# Patient Record
Sex: Female | Born: 1990 | Race: White | Hispanic: No | Marital: Single | Smoking: Current every day smoker
Health system: Southern US, Community
[De-identification: ages and names within clinical notes are randomized; demographics above are authoritative.]

## PROBLEM LIST (undated history)

## (undated) HISTORY — PX: APPENDECTOMY: SHX54

---

## 2012-02-21 ENCOUNTER — Emergency Department: Payer: Self-pay | Admitting: Emergency Medicine

## 2012-02-21 LAB — CBC
HCT: 37.7 % (ref 35.0–47.0)
HGB: 12.7 g/dL (ref 12.0–16.0)
MCHC: 33.6 g/dL (ref 32.0–36.0)
MCV: 84 fL (ref 80–100)
Platelet: 253 10*3/uL (ref 150–440)
RDW: 13.5 % (ref 11.5–14.5)
WBC: 10.5 10*3/uL (ref 3.6–11.0)

## 2012-02-21 LAB — COMPREHENSIVE METABOLIC PANEL
Albumin: 4 g/dL (ref 3.4–5.0)
Alkaline Phosphatase: 76 U/L (ref 50–136)
Anion Gap: 8 (ref 7–16)
BUN: 10 mg/dL (ref 7–18)
Calcium, Total: 8.7 mg/dL (ref 8.5–10.1)
Creatinine: 0.84 mg/dL (ref 0.60–1.30)
Glucose: 94 mg/dL (ref 65–99)
Osmolality: 278 (ref 275–301)
Potassium: 3.6 mmol/L (ref 3.5–5.1)
SGOT(AST): 20 U/L (ref 15–37)
Sodium: 140 mmol/L (ref 136–145)
Total Protein: 7.8 g/dL (ref 6.4–8.2)

## 2012-02-21 LAB — URINALYSIS, COMPLETE
Bacteria: NONE SEEN
Bilirubin,UR: NEGATIVE
Ketone: NEGATIVE
Nitrite: NEGATIVE
Specific Gravity: 1.027 (ref 1.003–1.030)
Squamous Epithelial: 2
WBC UR: 4 /HPF (ref 0–5)

## 2012-02-21 LAB — PREGNANCY, URINE: Pregnancy Test, Urine: NEGATIVE m[IU]/mL

## 2012-02-21 LAB — LIPASE, BLOOD: Lipase: 121 U/L (ref 73–393)

## 2012-02-21 LAB — WET PREP, GENITAL

## 2013-03-09 IMAGING — CT CT STONE STUDY
1 of 2 series · 16 of 32 positions shown, 20 images · non-contrast
Comparison: none

REASON FOR EXAM: RLQ PAIN, MICROSCOPIC HEMATURIA
COMMENTS:   May transport without cardiac monitor

PROCEDURE:     CT  - CT ABDOMEN /PELVIS WO (STONE)  - February 21, 2012  [DATE]
RESULT:
TECHNIQUE: Helical noncontrast 3 mm sections were obtained from the lung
bases through the pubic symphysis.

[Series 2: 3mm soft tissue · axial · 0.70mm/px · z∈[-469,-37]mm · 16 of 156 slices shown, 20 images]
[im 6/156  soft-tissue]
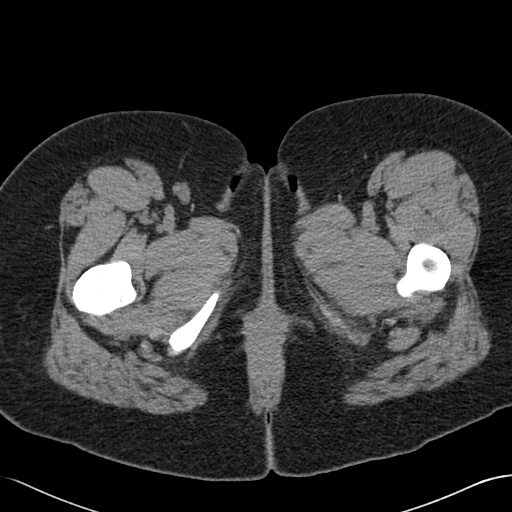
[im 6/156  bone]
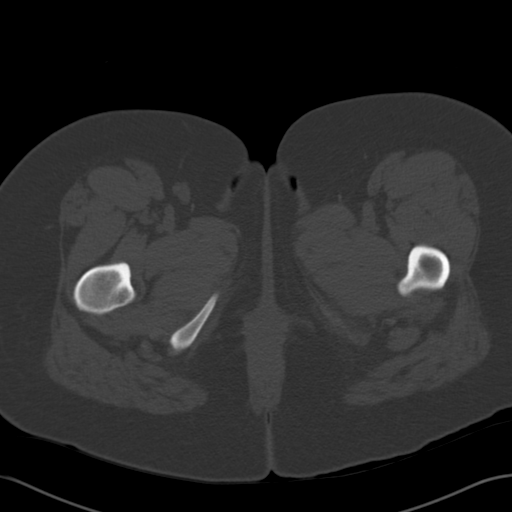
[im 18/156  soft-tissue]
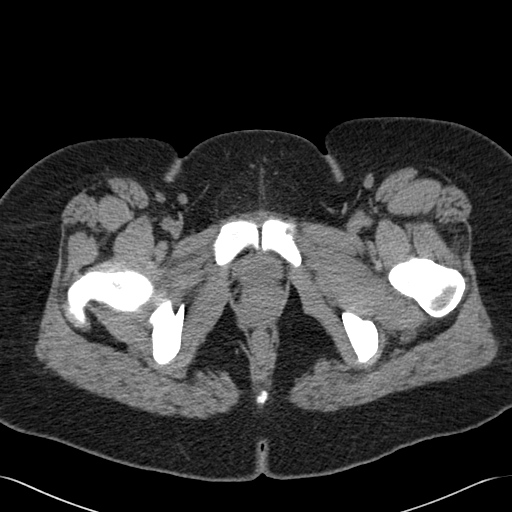
[im 30/156  soft-tissue]
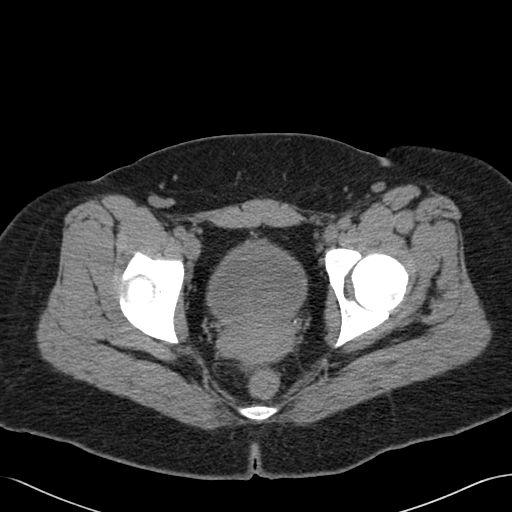
[im 42/156  soft-tissue]
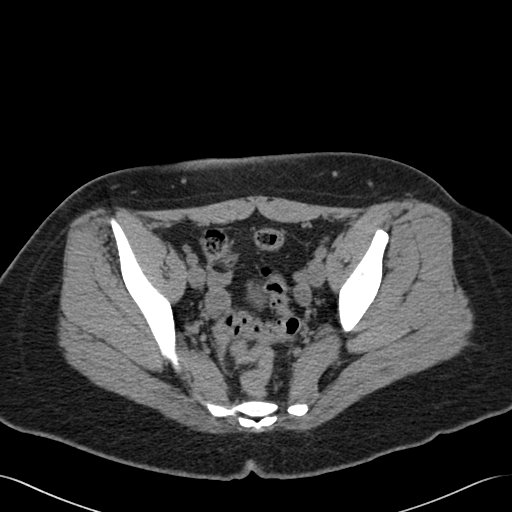
[im 54/156  soft-tissue]
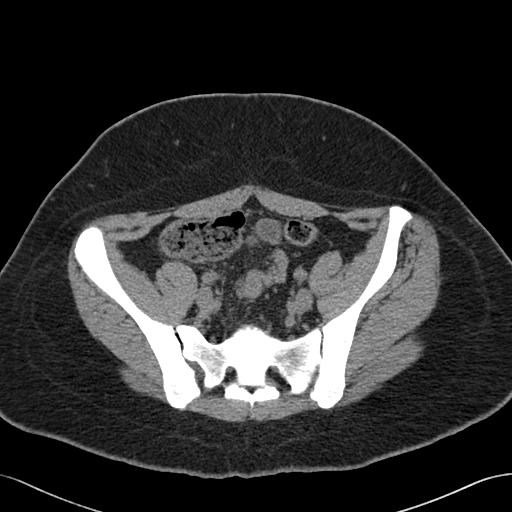
[im 60/156  soft-tissue]
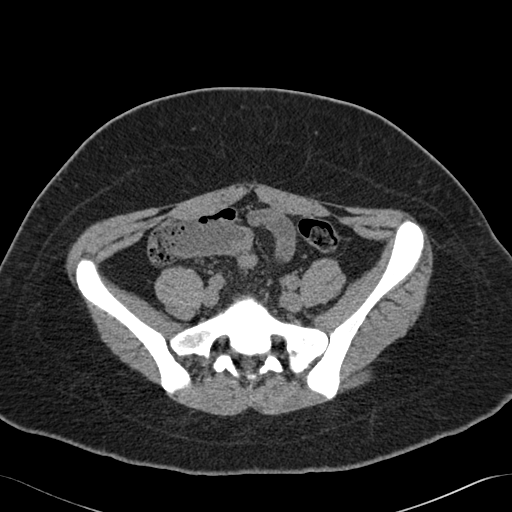
[im 72/156  soft-tissue]
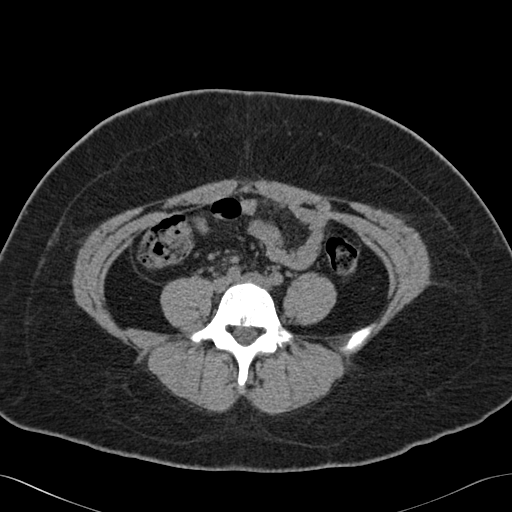
[im 84/156  soft-tissue]
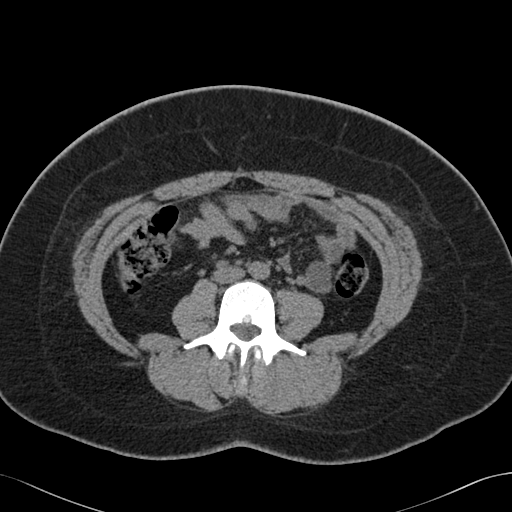
[im 96/156  soft-tissue]
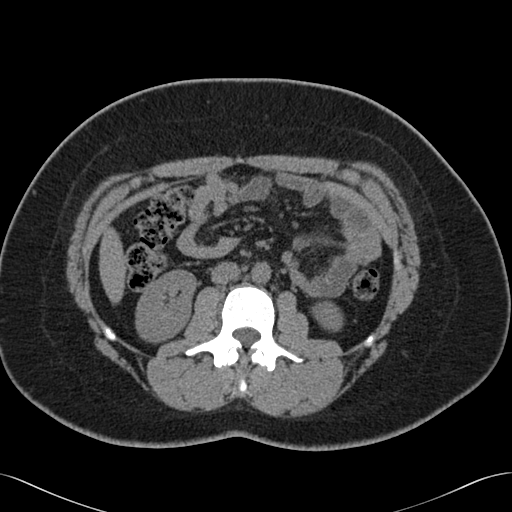
[im 96/156  bone]
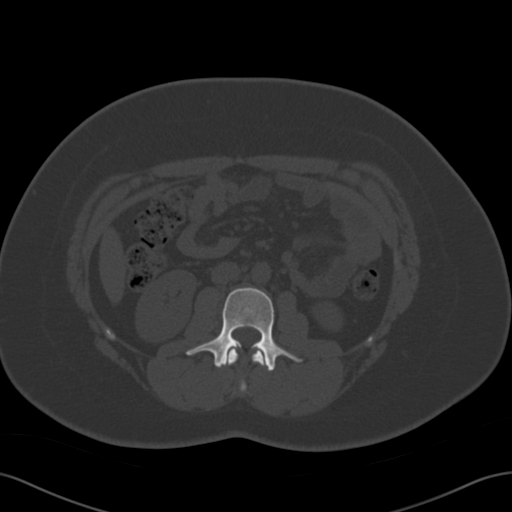
[im 102/156  soft-tissue]
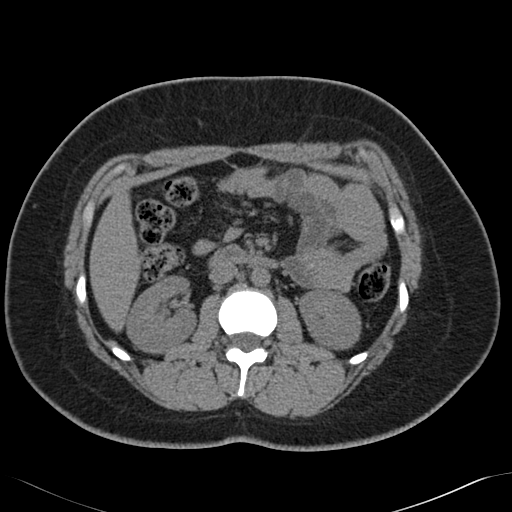
[im 114/156  soft-tissue]
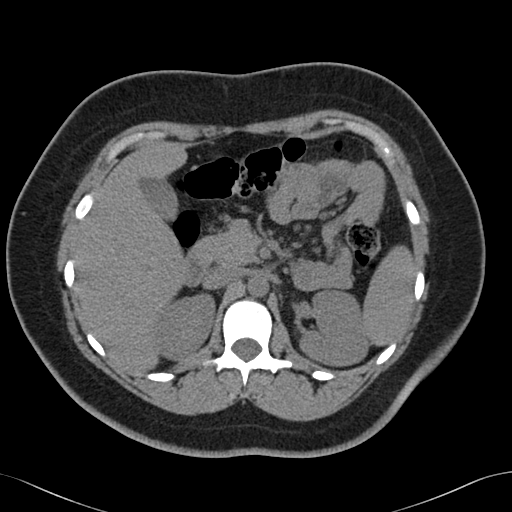
[im 126/156  soft-tissue]
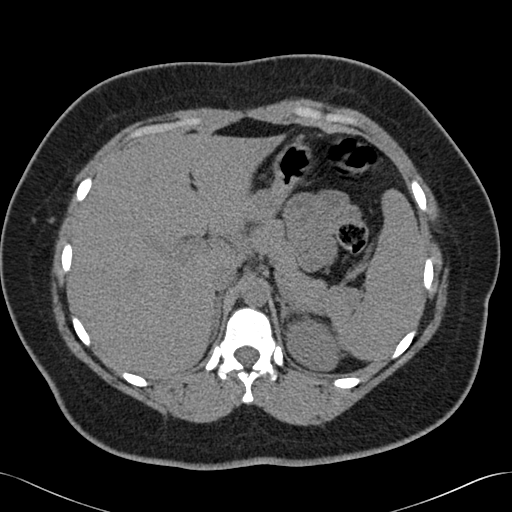
[im 132/156  lung]
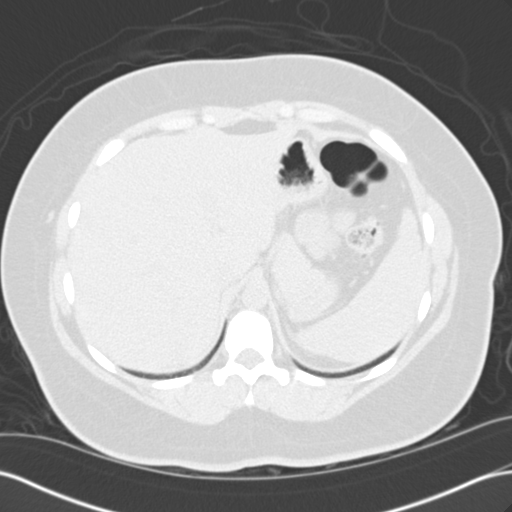
[im 138/156  soft-tissue]
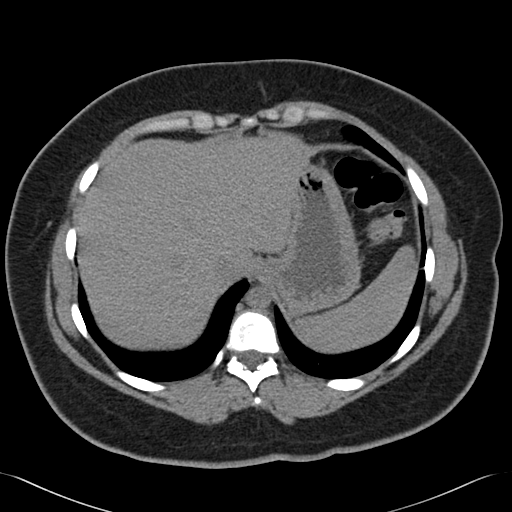
[im 138/156  lung]
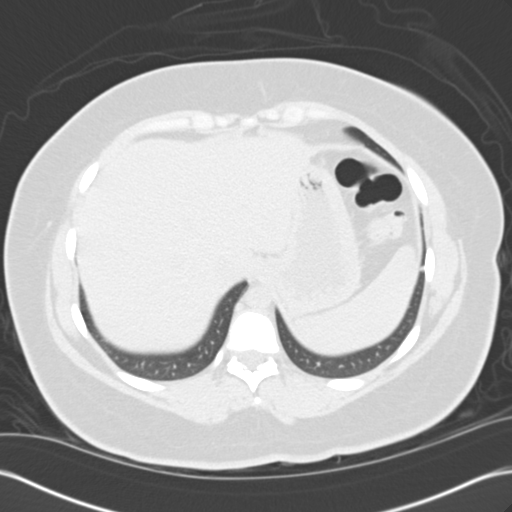
[im 144/156  lung]
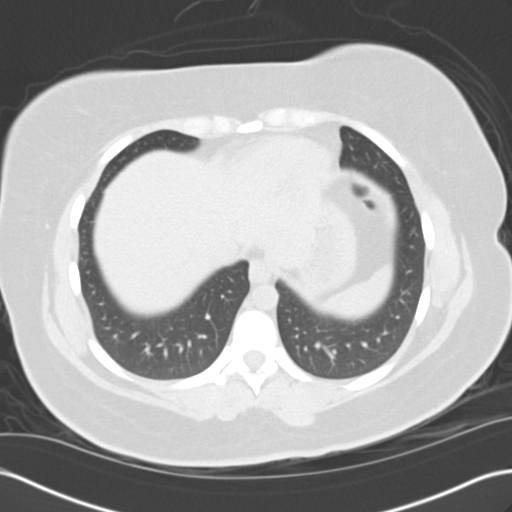
[im 150/156  soft-tissue]
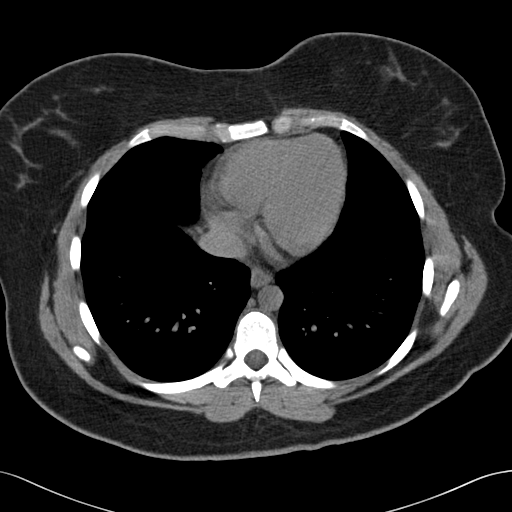
[im 150/156  lung]
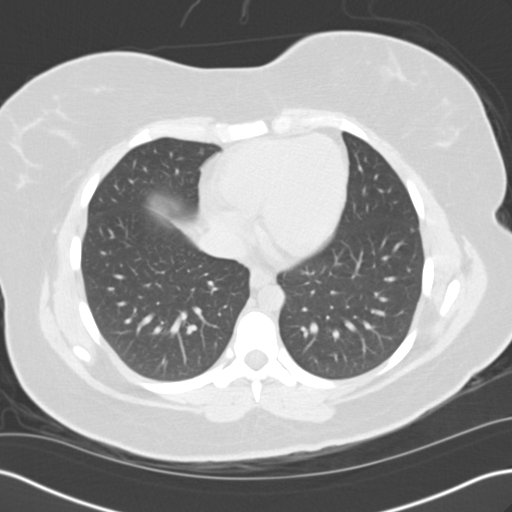

[16 of 32 positions shown; findings below may reference images not displayed]

FINDINGS: The lung bases are unremarkable.

Noncontrast evaluation of the liver, spleen, adrenals, pancreas, and kidneys
is unremarkable. There are no secondary signs reflecting enteritis, colitis,
diverticulitis or appendicitis. The appendix is not clearly identified.
There is no evidence of an abdominal aortic aneurysm.
IMPRESSION: 1.     Within the limitations of a noncontrast CT, there is no evidence of
obstructive or inflammatory abnormalities.
2.     Dr. Kaichi of the Emergency Department was informed of these findings
via a preliminary faxed report.

## 2013-03-09 IMAGING — US US PELV - US TRANSVAGINAL
1 series · 14 of 25 positions shown · non-contrast
Comparison: none

REASON FOR EXAM: R ADNEXAL PAIN
COMMENTS:   May transport without cardiac monitor

[Series 1: us pelv - us transvaginal · 0.26mm/px · 14 of 62 slices shown]
[im 1/62]
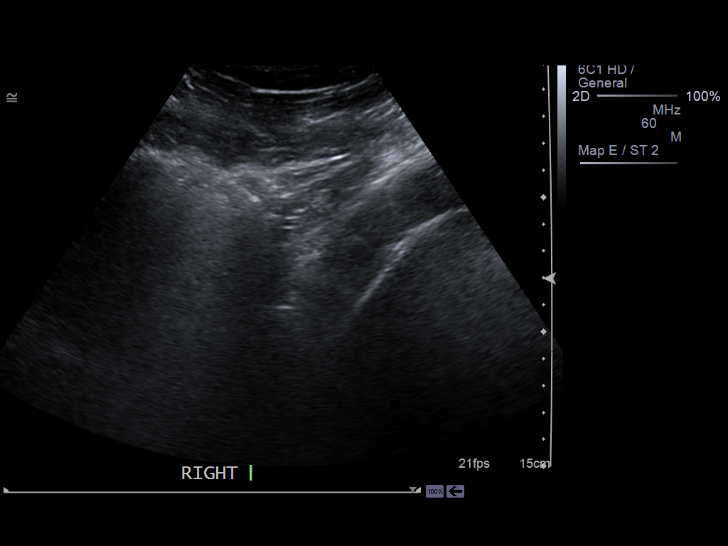
[im 6/62]
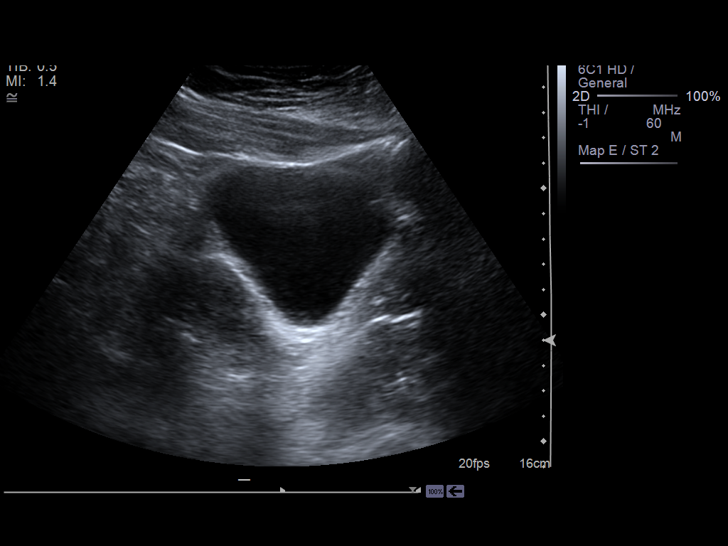
[im 11/62]
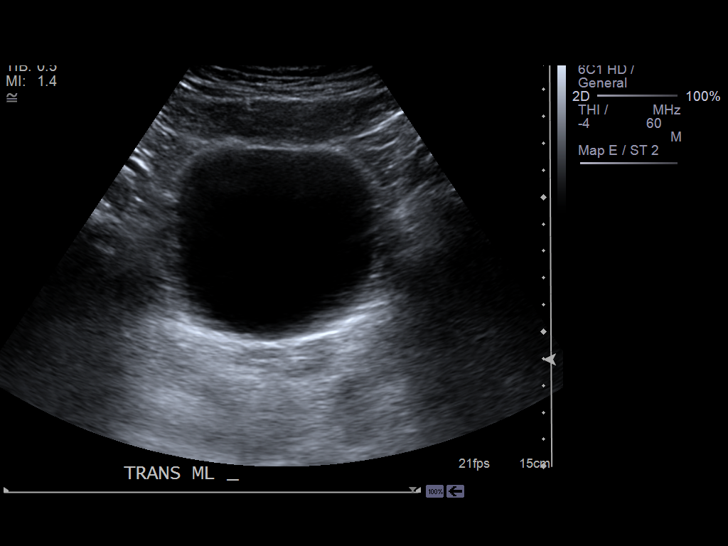
[im 16/62]
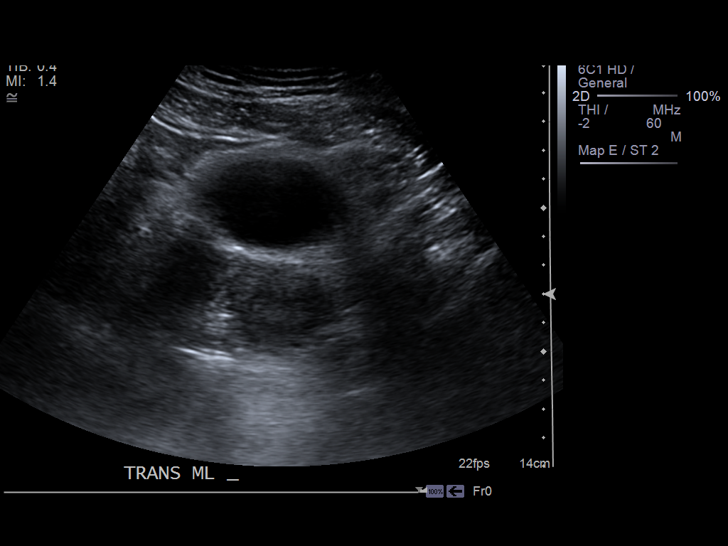
[im 21/62]
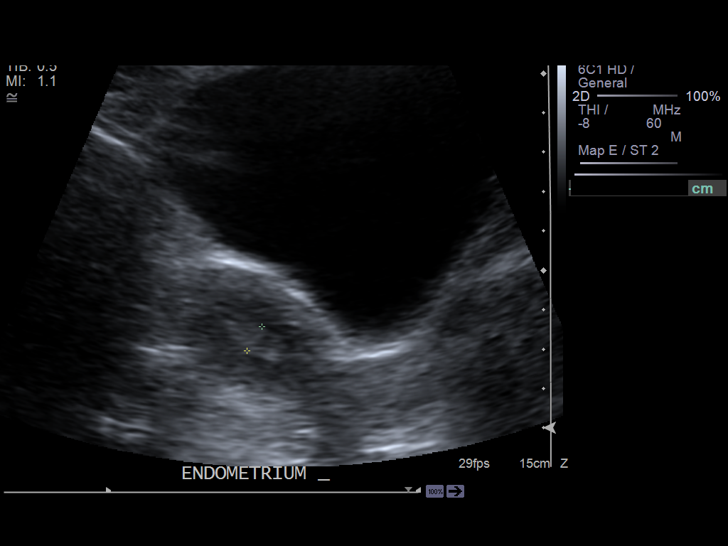
[im 23/62]
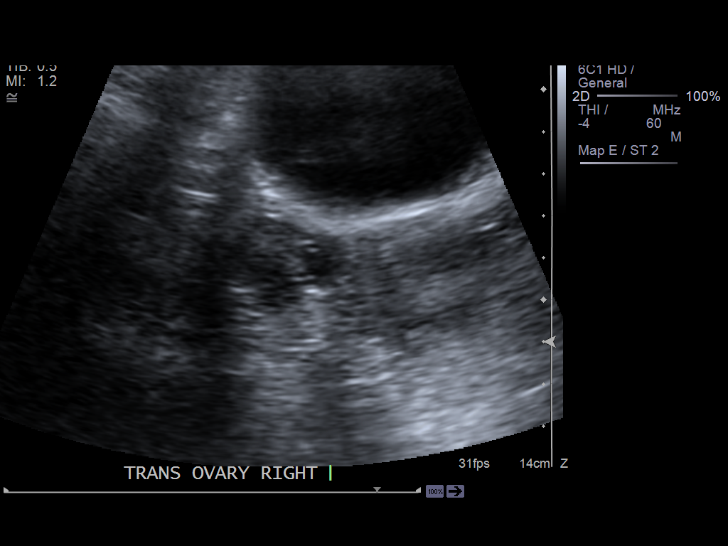
[im 28/62]
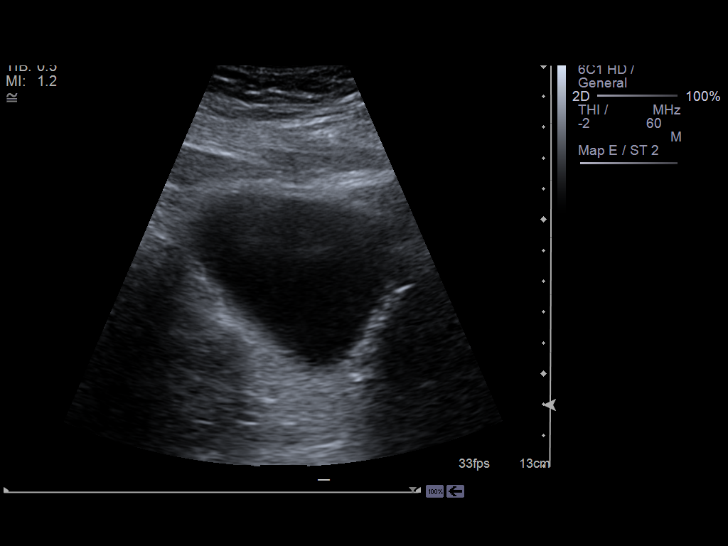
[im 34/62]
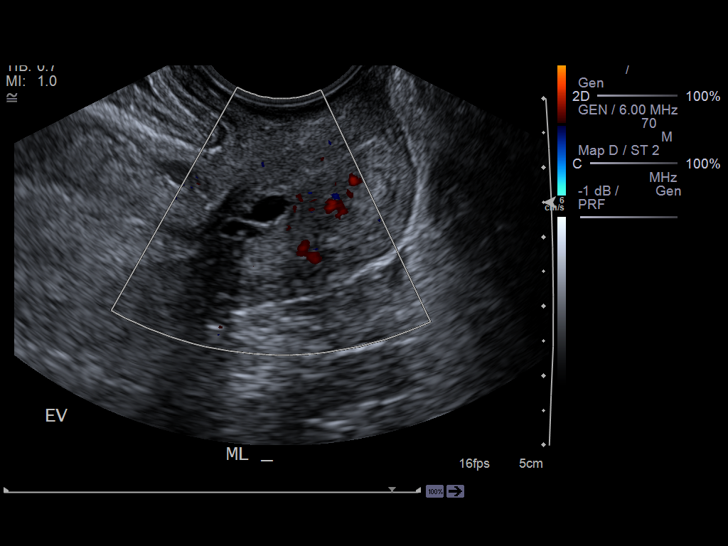
[im 39/62]
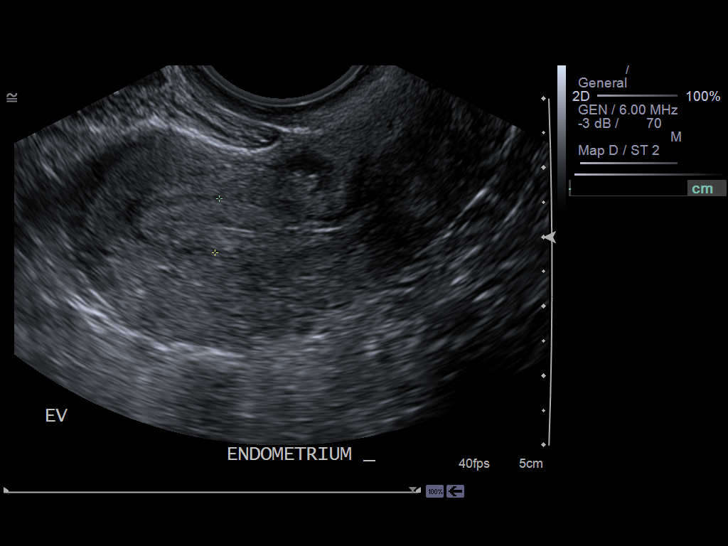
[im 41/62]
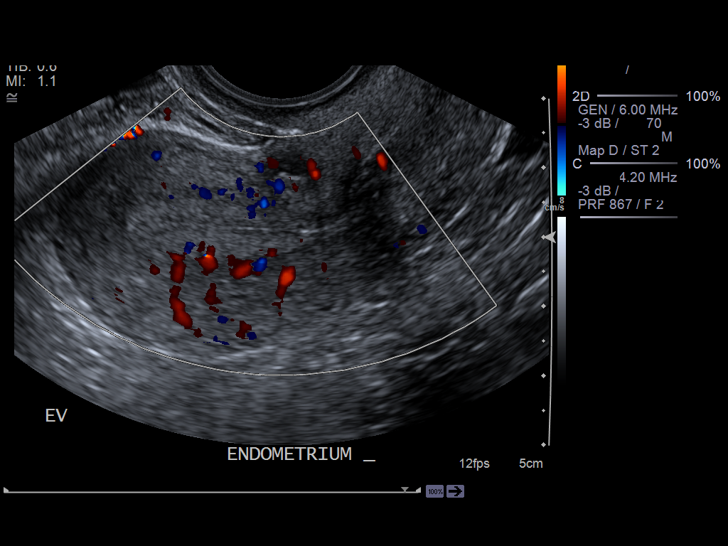
[im 46/62]
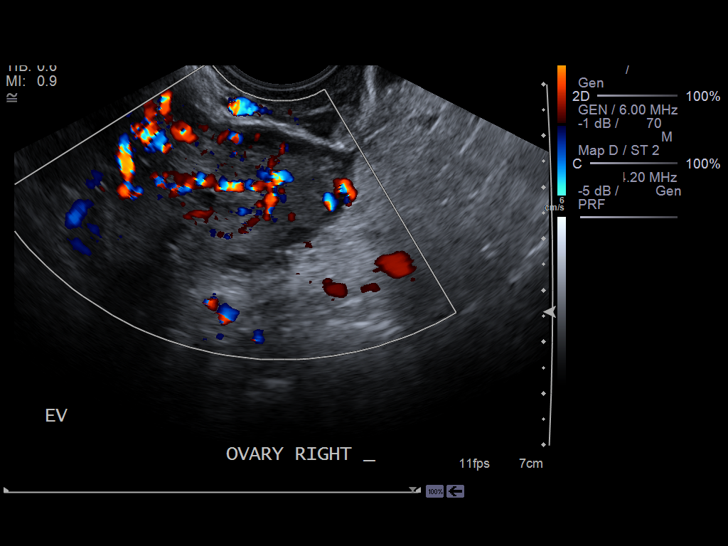
[im 51/62]
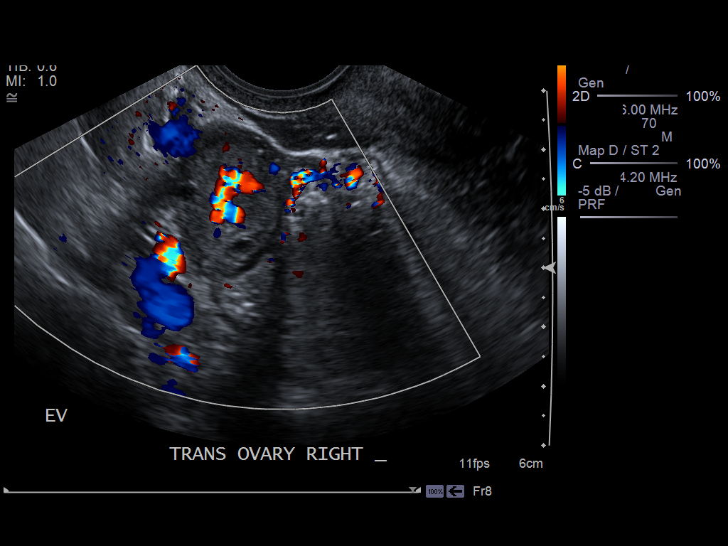
[im 56/62]
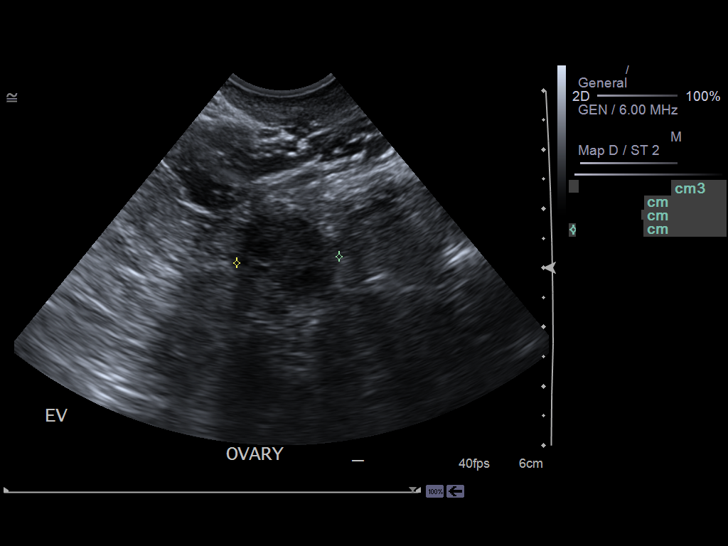
[im 62/62]
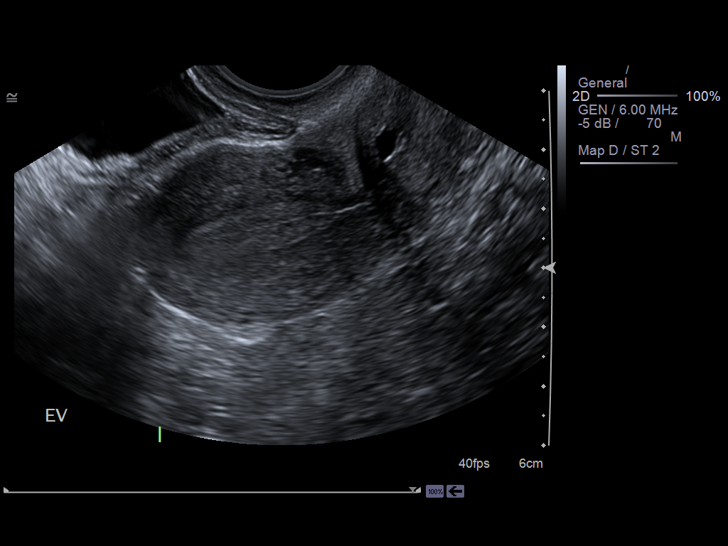

[14 of 25 positions shown; findings below may reference images not displayed]

PROCEDURE:     US  - US PELVIS EXAM W/TRANSVAGINAL  - February 21, 2012 [DATE]

RESULT:     Transabdominal and endovaginal Pelvic Sonogram is performed. The
uterus measures 6.68 x 2.63 x 3.74 cm. The endometrium is 7.8 mm thick. The
kidneys appear grossly normal. Small Nabothian cysts are seen at the cervix
measuring up to 6.6 mm in diameter. Both ovaries appear to be normal in size
and echotexture. There is no evidence of torsion. The right ovary measures
3.21 x 2.43 x 2.19 cm. The left ovary measures 3.42 x 1.21 x 1.73 cm. There
is no free fluid or abnormal fluid collection.
IMPRESSION: Nabothian cysts. No other significant finding.

[REDACTED]

## 2017-05-04 DIAGNOSIS — X58XXXA Exposure to other specified factors, initial encounter: Secondary | ICD-10-CM | POA: Insufficient documentation

## 2017-05-04 DIAGNOSIS — Y939 Activity, unspecified: Secondary | ICD-10-CM | POA: Insufficient documentation

## 2017-05-04 DIAGNOSIS — S0502XA Injury of conjunctiva and corneal abrasion without foreign body, left eye, initial encounter: Secondary | ICD-10-CM | POA: Insufficient documentation

## 2017-05-04 DIAGNOSIS — F17210 Nicotine dependence, cigarettes, uncomplicated: Secondary | ICD-10-CM | POA: Insufficient documentation

## 2017-05-04 DIAGNOSIS — Y999 Unspecified external cause status: Secondary | ICD-10-CM | POA: Insufficient documentation

## 2017-05-04 DIAGNOSIS — Y929 Unspecified place or not applicable: Secondary | ICD-10-CM | POA: Insufficient documentation

## 2017-05-05 ENCOUNTER — Emergency Department
Admission: EM | Admit: 2017-05-05 | Discharge: 2017-05-05 | Disposition: A | Discharge status: Home / Self Care | Payer: Self-pay | Attending: Emergency Medicine | Admitting: Emergency Medicine

## 2017-05-05 ENCOUNTER — Encounter: Payer: Self-pay | Admitting: Emergency Medicine

## 2017-05-05 DIAGNOSIS — S0502XA Injury of conjunctiva and corneal abrasion without foreign body, left eye, initial encounter: Secondary | ICD-10-CM

## 2017-05-05 MED ORDER — TETRACAINE HCL 0.5 % OP SOLN
OPHTHALMIC | Status: AC
  Administered 2017-05-05: 03:00:00
  Filled 2017-05-05: qty 4

## 2017-05-05 MED ORDER — OFLOXACIN 0.3 % OP SOLN: 1.0000 [drp] | Freq: Three times a day (TID) | OPHTHALMIC | 0 refills | Status: AC

## 2017-05-05 MED ORDER — FLUORESCEIN SODIUM 0.6 MG OP STRP
ORAL_STRIP | OPHTHALMIC | Status: AC
  Administered 2017-05-05: 03:00:00
  Filled 2017-05-05: qty 1

## 2017-05-05 MED ORDER — OFLOXACIN 0.3 % OP SOLN
2.0000 [drp] | Freq: Four times a day (QID) | OPHTHALMIC | Status: DC
  Filled 2017-05-05: qty 5

## 2017-05-05 NOTE — ED Notes (Signed)
Dr. Brown at bedside

## 2017-05-05 NOTE — ED Triage Notes (Signed)
Pt presents to ED with left eye pain. Sudden onset approx 1 hour ago. No known injury. Denies drainage.

## 2017-05-05 NOTE — ED Provider Notes (Signed)
[facility] Emergency Department Provider Note   First MD Initiated Contact with Patient 05/05/17 [phone]     (approximate)  I have reviewed the triage vital signs and the nursing notes.   HISTORY  Chief Complaint Eye Pain    HPI Jill Walton is a 26 y.o. female presents to the emergency department with left eye pain which began one hour prior to arrival. Patient states that something went into her left eye and she attempted to remove it and since that time is felt discomfort in her left eye and the sensation that something is in there. Patient denies any contact lens use does not wear glasses.   Past medical history None There are no active problems to display for this patient.   Past Surgical History:  Procedure Laterality Date  . APPENDECTOMY      Prior to Admission medications   Not on File    Allergies Diazepam  No family history on file.  Social History Social History  Substance Use Topics  . Smoking status: Current Every Day Smoker    Packs/day: 0.50    Types: Cigarettes  . Smokeless tobacco: Never Used  . Alcohol use No    Review of Systems Constitutional: No fever/chills Eyes: No visual changes.Positive for left eye pain ENT: No sore throat. Cardiovascular: Denies chest pain. Respiratory: Denies shortness of breath. Gastrointestinal: No abdominal pain.  No nausea, no vomiting.  No diarrhea.  No constipation. Genitourinary: Negative for dysuria. Musculoskeletal: Negative for neck pain.  Negative for back pain. Integumentary: Negative for rash. Neurological: Negative for headaches, focal weakness or numbness.  ____________________________________________   PHYSICAL EXAM:  VITAL SIGNS: ED Triage Vitals  Enc Vitals Group     BP 05/05/17 0008 136/88     Pulse Rate 05/05/17 0008 88     Resp 05/05/17 0008 18     Temp 05/05/17 0008 98.7 F (37.1 C)     Temp Source 05/05/17 0008 Oral     SpO2 05/05/17 0008 99 %     Weight  05/05/17 0009 91.6 kg (202 lb)     Height 05/05/17 0009 1.626 m (5\' 4" )     Head Circumference --      Peak Flow --      Pain Score 05/05/17 0010 8     Pain Loc --      Pain Edu? --      Excl. in GC? --     Constitutional: Alert and oriented. Well appearing and in no acute distress. Eyes: Conjunctivae are normal. Corneal abrasion noted at 3:00 position Head: Atraumatic. Cardiovascular: Normal rate, regular rhythm. Good peripheral circulation. Grossly normal heart sounds. Respiratory: Normal respiratory effort.  No retractions. Lungs CTAB. Neurologic:  Normal speech and language. No gross focal neurologic deficits are appreciated.  Skin:  Skin is warm, dry and intact. No rash noted. Psychiatric: Mood and affect are normal. Speech and behavior are normal.   Procedures   ____________________________________________   INITIAL IMPRESSION / ASSESSMENT AND PLAN / ED COURSE  Pertinent labs & imaging results that were available during my care of the patient were reviewed by me and considered in my medical decision making (see chart for details).  26-year-old history physical exam consistent with corneal abrasion which was confirmed via fluorescein staining. Patient given ofloxacin eyedrops      ____________________________________________  FINAL CLINICAL IMPRESSION(S) / ED DIAGNOSES  Final diagnoses:  Abrasion of left cornea, initial encounter     MEDICATIONS GIVEN DURING THIS  VISIT:  Medications  tetracaine (PONTOCAINE) 0.5 % ophthalmic solution (not administered)  fluorescein 0.6 MG ophthalmic strip (not administered)     NEW OUTPATIENT MEDICATIONS STARTED DURING THIS VISIT:  New Prescriptions   No medications on file    Modified Medications   No medications on file    Discontinued Medications   No medications on file     Note:  This document was prepared using Dragon voice recognition software and may include unintentional dictation errors.    Darci Current, MD 05/05/17 [phone]

## 2017-05-05 NOTE — ED Notes (Signed)
Pt states that she thinks she scratched her left eye. States she was painting her nails while fan was going and she felt something in her eye. Started rubbing the eye and now its hurting, its red, and she cant see out of the peripheral.

## 2018-08-15 ENCOUNTER — Emergency Department
Admission: EM | Admit: 2018-08-15 | Discharge: 2018-08-15 | Discharge status: Against Medical Advice | Payer: Self-pay | Attending: Emergency Medicine | Admitting: Emergency Medicine

## 2018-08-15 ENCOUNTER — Other Ambulatory Visit: Payer: Self-pay

## 2018-08-15 ENCOUNTER — Encounter: Payer: Self-pay | Admitting: Emergency Medicine

## 2018-08-15 DIAGNOSIS — R1030 Lower abdominal pain, unspecified: Secondary | ICD-10-CM | POA: Insufficient documentation

## 2018-08-15 DIAGNOSIS — Z5321 Procedure and treatment not carried out due to patient leaving prior to being seen by health care provider: Secondary | ICD-10-CM | POA: Insufficient documentation

## 2018-08-15 DIAGNOSIS — R51 Headache: Secondary | ICD-10-CM | POA: Insufficient documentation

## 2018-08-15 LAB — CBC
HEMATOCRIT: 38.3 % (ref 36.0–46.0)
HEMOGLOBIN: 12.6 g/dL (ref 12.0–15.0)
MCH: 28.8 pg (ref 26.0–34.0)
MCHC: 32.9 g/dL (ref 30.0–36.0)
MCV: 87.6 fL (ref 80.0–100.0)
Platelets: 298 10*3/uL (ref 150–400)
RBC: 4.37 MIL/uL (ref 3.87–5.11)
RDW: 14.2 % (ref 11.5–15.5)
WBC: 13.5 10*3/uL — AB (ref 4.0–10.5)
nRBC: 0 % (ref 0.0–0.2)

## 2018-08-15 LAB — BASIC METABOLIC PANEL
ANION GAP: 10 (ref 5–15)
BUN: 12 mg/dL (ref 6–20)
CO2: 24 mmol/L (ref 22–32)
Calcium: 9 mg/dL (ref 8.9–10.3)
Chloride: 105 mmol/L (ref 98–111)
Creatinine, Ser: 0.8 mg/dL (ref 0.44–1.00)
GFR calc Af Amer: >60 mL/min (ref >60)
GFR calc non Af Amer: >60 mL/min (ref >60)
GLUCOSE: 96 mg/dL (ref 70–99)
POTASSIUM: 3.5 mmol/L (ref 3.5–5.1)
SODIUM: 139 mmol/L (ref 135–145)

## 2018-08-15 NOTE — ED Notes (Signed)
Pt called to room without answer.  

## 2018-08-15 NOTE — ED Notes (Signed)
Pt called to room without answer. Looked in restroom and outside.

## 2018-08-15 NOTE — ED Triage Notes (Signed)
Pt reports abd pain to lower mid abd since this am. Pt states started her cycle at work and had to borrow a tampon and is concerned about toxic shock syndrome. Pt reports thinks she may have passed out in the bathroom at work. Pt reports has removed the tampon.

## 2018-09-15 ENCOUNTER — Encounter: Payer: Self-pay | Admitting: Emergency Medicine

## 2018-09-15 ENCOUNTER — Other Ambulatory Visit: Payer: Self-pay

## 2018-09-15 ENCOUNTER — Emergency Department
Admission: EM | Admit: 2018-09-15 | Discharge: 2018-09-15 | Disposition: A | Discharge status: Home / Self Care | Payer: Self-pay | Attending: Emergency Medicine | Admitting: Emergency Medicine

## 2018-09-15 DIAGNOSIS — R109 Unspecified abdominal pain: Secondary | ICD-10-CM

## 2018-09-15 DIAGNOSIS — F17210 Nicotine dependence, cigarettes, uncomplicated: Secondary | ICD-10-CM | POA: Insufficient documentation

## 2018-09-15 DIAGNOSIS — N390 Urinary tract infection, site not specified: Secondary | ICD-10-CM | POA: Insufficient documentation

## 2018-09-15 LAB — CBC
HEMATOCRIT: 38 % (ref 36.0–46.0)
Hemoglobin: 12.4 g/dL (ref 12.0–15.0)
MCH: 29 pg (ref 26.0–34.0)
MCHC: 32.6 g/dL (ref 30.0–36.0)
MCV: 89 fL (ref 80.0–100.0)
NRBC: 0 % (ref 0.0–0.2)
Platelets: 295 10*3/uL (ref 150–400)
RBC: 4.27 MIL/uL (ref 3.87–5.11)
RDW: 14.2 % (ref 11.5–15.5)
WBC: 12.6 10*3/uL — AB (ref 4.0–10.5)

## 2018-09-15 LAB — URINALYSIS, COMPLETE (UACMP) WITH MICROSCOPIC
BACTERIA UA: NONE SEEN
BILIRUBIN URINE: NEGATIVE
GLUCOSE, UA: NEGATIVE mg/dL
Ketones, ur: NEGATIVE mg/dL
LEUKOCYTES UA: NEGATIVE
NITRITE: NEGATIVE
PH: 6 (ref 5.0–8.0)
Protein, ur: 100 mg/dL — AB
RBC / HPF: >50 RBC/hpf — ABNORMAL HIGH (ref 0–5)
SPECIFIC GRAVITY, URINE: 1.029 (ref 1.005–1.030)
WBC, UA: >50 WBC/hpf — ABNORMAL HIGH (ref 0–5)

## 2018-09-15 LAB — COMPREHENSIVE METABOLIC PANEL
ALT: 27 U/L (ref 0–44)
AST: 22 U/L (ref 15–41)
Albumin: 4.6 g/dL (ref 3.5–5.0)
Alkaline Phosphatase: 57 U/L (ref 38–126)
Anion gap: 8 (ref 5–15)
BILIRUBIN TOTAL: 0.9 mg/dL (ref 0.3–1.2)
BUN: 13 mg/dL (ref 6–20)
CHLORIDE: 105 mmol/L (ref 98–111)
CO2: 26 mmol/L (ref 22–32)
CREATININE: 0.71 mg/dL (ref 0.44–1.00)
Calcium: 8.7 mg/dL — ABNORMAL LOW (ref 8.9–10.3)
Glucose, Bld: 116 mg/dL — ABNORMAL HIGH (ref 70–99)
POTASSIUM: 3.3 mmol/L — AB (ref 3.5–5.1)
Sodium: 139 mmol/L (ref 135–145)
TOTAL PROTEIN: 7.5 g/dL (ref 6.5–8.1)

## 2018-09-15 LAB — POCT PREGNANCY, URINE: Preg Test, Ur: NEGATIVE

## 2018-09-15 LAB — LIPASE, BLOOD: LIPASE: 24 U/L (ref 11–51)

## 2018-09-15 MED ORDER — BUTALBITAL-APAP-CAFFEINE 50-325-40 MG PO TABS: 1.0000 | ORAL_TABLET | Freq: Four times a day (QID) | ORAL | 0 refills | Status: AC | PRN

## 2018-09-15 MED ORDER — BUTALBITAL-APAP-CAFFEINE 50-325-40 MG PO TABS: 1.0000 | ORAL_TABLET | Freq: Four times a day (QID) | ORAL | 0 refills | Status: DC | PRN

## 2018-09-15 MED ORDER — NITROFURANTOIN MONOHYD MACRO 100 MG PO CAPS: 100.0000 mg | ORAL_CAPSULE | Freq: Two times a day (BID) | ORAL | 0 refills | Status: DC

## 2018-09-15 MED ORDER — BUTALBITAL-APAP-CAFFEINE 50-325-40 MG PO TABS
1.0000 | ORAL_TABLET | Freq: Once | ORAL | Status: AC
  Administered 2018-09-15: 1 via ORAL
  Filled 2018-09-15: qty 1

## 2018-09-15 MED ORDER — NITROFURANTOIN MONOHYD MACRO 100 MG PO CAPS
100.0000 mg | ORAL_CAPSULE | Freq: Two times a day (BID) | ORAL | 0 refills | Status: AC
Start: 2018-09-15 — End: 2018-09-20

## 2018-09-15 NOTE — ED Provider Notes (Addendum)
[facility] Emergency Department Provider Note   ____________________________________________   I have reviewed the triage vital signs and the nursing notes.   HISTORY  Chief Complaint Abdominal Pain   History limited by: Not Limited   HPI Jill Walton is a 28 y.o. female who presents to the emergency department today because of concerns for bad abdominal cramping.  She states it is located in the lower abdomen.  It started this morning.  She states it was severe to the point where she passed out.  She states she does typically have bad cramping with her periods but is never been this severe. It has been accompanied by some vomiting.  Patient is currently menstruating.  Patient denies any fevers.     Per medical record review patient has a history of appendectomy.  History reviewed. No pertinent past medical history.  There are no active problems to display for this patient.   Past Surgical History:  Procedure Laterality Date  . APPENDECTOMY      Prior to Admission medications   Not on File    Allergies Diazepam and Oxycodone  History reviewed. No pertinent family history.  Social History Social History   Tobacco Use  . Smoking status: Current Every Day Smoker    Packs/day: 0.50    Types: Cigarettes  . Smokeless tobacco: Never Used  Substance Use Topics  . Alcohol use: No  . Drug use: No    Review of Systems Constitutional: No fever/chills Eyes: No visual changes. ENT: No sore throat. Cardiovascular: Denies chest pain. Respiratory: Denies shortness of breath. Gastrointestinal: Positive for abdominal pain, vomtiing. Genitourinary: Negative for dysuria. Musculoskeletal: Negative for back pain. Skin: Negative for rash. Neurological: Negative for headaches, focal weakness or numbness.  ____________________________________________   PHYSICAL EXAM:  VITAL SIGNS: ED Triage Vitals  Enc Vitals Group     BP 09/15/18 1408 121/60      Pulse Rate 09/15/18 1408 75     Resp 09/15/18 1408 18     Temp 09/15/18 1408 98.4 F (36.9 C)     Temp Source 09/15/18 1408 Oral     SpO2 09/15/18 1408 100 %     Weight 09/15/18 1409 210 lb (95.3 kg)     Height 09/15/18 1409 5\' 4"  (1.626 m)     Head Circumference --      Peak Flow --      Pain Score 09/15/18 1409 0   Constitutional: Alert and oriented.  Eyes: Conjunctivae are normal.  ENT      Head: Normocephalic and atraumatic.      Nose: No congestion/rhinnorhea.      Mouth/Throat: Mucous membranes are moist.      Neck: No stridor. Hematological/Lymphatic/Immunilogical: No cervical lymphadenopathy. Cardiovascular: Normal rate, regular rhythm.  No murmurs, rubs, or gallops.  Respiratory: Normal respiratory effort without tachypnea nor retractions. Breath sounds are clear and equal bilaterally. No wheezes/rales/rhonchi. Gastrointestinal: Soft and non tender. No rebound. No guarding.  Genitourinary: Deferred Musculoskeletal: Normal range of motion in all extremities. No lower extremity edema. Neurologic:  Normal speech and language. No gross focal neurologic deficits are appreciated.  Skin:  Skin is warm, dry and intact. No rash noted. Psychiatric: Mood and affect are normal. Speech and behavior are normal. Patient exhibits appropriate insight and judgment.  ____________________________________________    LABS (pertinent positives/negatives)  Upreg negative CMP wnl except k 3.3, glu 116, ca 8.7 UA cloudy, large hgb dipstick, protein 100, >50 rbc and wbc CBC wbc 12.6, hgb 12.4,  plt 295 ____________________________________________   EKG  I, Phineas Semen, attending physician, personally viewed and interpreted this EKG  EKG Time: 1414 Rate: 84 Rhythm: normal sinus rhythm Axis: normal Intervals: qtc 432 QRS: narrow ST changes: no st elevation Impression: normal ekg   ____________________________________________     RADIOLOGY  None  ____________________________________________   PROCEDURES  Procedures  ____________________________________________   INITIAL IMPRESSION / ASSESSMENT AND PLAN / ED COURSE  Pertinent labs & imaging results that were available during my care of the patient were reviewed by me and considered in my medical decision making (see chart for details).   Patient presented to the emergency department today with concerns for lower abdominal cramping in the setting of menstruation.  On exam patient without any abdominal tenderness.  Work-up is fairly unremarkable however there were some white blood cells in the urine.  Do wonder if patient suffering from possible urinary tract infection.  Discussed findings with the patient.  Did offer to perform ultrasound however at this point I doubt ovarian torsion or significant ovarian pathology.  Patient felt comfortable deferring ultrasound at this time.  ____________________________________________   FINAL CLINICAL IMPRESSION(S) / ED DIAGNOSES  Final diagnoses:  Abdominal pain, unspecified abdominal location  Lower urinary tract infectious disease     Note: This dictation was prepared with Dragon dictation. Any transcriptional errors that result from this process are unintentional     Phineas Semen, MD 09/15/18 1821    Phineas Semen, MD 09/25/18 [phone]

## 2018-09-15 NOTE — ED Triage Notes (Signed)
Started with lower abdominal pain and cramping. Pt reports started period today but this is different than her period pain. Passed out in bathroom at work because of pain.  No urinary sx.  No fevers. Vomited X 3 today.  VSS. NAD. Color WNL at this time.

## 2018-09-15 NOTE — ED Notes (Signed)

## 2018-09-15 NOTE — Discharge Instructions (Addendum)
Please seek medical attention for any high fevers, chest pain, shortness of breath, change in behavior, persistent vomiting, bloody stool or any other new or concerning symptoms.
